# Patient Record
Sex: Male | Born: 1985 | Race: White | Hispanic: No | Marital: Single | Smoking: Never smoker
Health system: Southern US, Community
[De-identification: ages and names within clinical notes are randomized; demographics above are authoritative.]

## PROBLEM LIST (undated history)

## (undated) DIAGNOSIS — G43909 Migraine, unspecified, not intractable, without status migrainosus: Secondary | ICD-10-CM

## (undated) HISTORY — PX: NECK SURGERY: SHX720

---

## 2009-12-31 ENCOUNTER — Encounter: Admission: RE | Admit: 2009-12-31 | Discharge: 2009-12-31 | Payer: Self-pay | Admitting: Orthopedic Surgery

## 2018-01-18 ENCOUNTER — Other Ambulatory Visit: Payer: Self-pay | Admitting: Otolaryngology

## 2018-01-18 DIAGNOSIS — R1310 Dysphagia, unspecified: Secondary | ICD-10-CM

## 2018-01-24 ENCOUNTER — Ambulatory Visit
Admission: RE | Admit: 2018-01-24 | Discharge: 2018-01-24 | Disposition: A | Discharge status: Home / Self Care | Payer: BLUE CROSS/BLUE SHIELD | Source: Ambulatory Visit | Attending: Otolaryngology | Admitting: Otolaryngology

## 2018-01-24 DIAGNOSIS — R1310 Dysphagia, unspecified: Secondary | ICD-10-CM | POA: Insufficient documentation

## 2018-08-08 HISTORY — PX: SHOULDER SURGERY: SHX246

## 2018-08-08 HISTORY — PX: HERNIA REPAIR: SHX51

## 2019-05-07 ENCOUNTER — Other Ambulatory Visit: Payer: Self-pay | Admitting: Orthopedic Surgery

## 2019-05-07 DIAGNOSIS — M25511 Pain in right shoulder: Secondary | ICD-10-CM

## 2019-05-21 ENCOUNTER — Ambulatory Visit
Admission: RE | Admit: 2019-05-21 | Discharge: 2019-05-21 | Disposition: A | Discharge status: Home / Self Care | Payer: BC Managed Care – PPO | Source: Ambulatory Visit | Attending: Orthopedic Surgery | Admitting: Orthopedic Surgery

## 2019-05-21 ENCOUNTER — Other Ambulatory Visit: Payer: Self-pay

## 2019-05-21 DIAGNOSIS — M25511 Pain in right shoulder: Secondary | ICD-10-CM

## 2019-05-21 MED ORDER — GADOBUTROL 1 MMOL/ML IV SOLN
1.0000 mL | Freq: Once | INTRAVENOUS | Status: AC | PRN
  Administered 2019-05-21: 0.05 mL

## 2019-05-21 MED ORDER — IOHEXOL 180 MG/ML  SOLN
20.0000 mL | Freq: Once | INTRAMUSCULAR | Status: AC | PRN
  Administered 2019-05-21: 11:00:00 5 mL via INTRA_ARTICULAR

## 2019-05-21 MED ORDER — LIDOCAINE HCL (PF) 1 % IJ SOLN
5.0000 mL | Freq: Once | INTRAMUSCULAR | Status: AC
  Administered 2019-05-21: 5 mL
  Filled 2019-05-21: qty 5

## 2019-05-21 MED ORDER — SODIUM CHLORIDE (PF) 0.9 % IJ SOLN
10.0000 mL | INTRAMUSCULAR | Status: DC | PRN
  Administered 2019-05-21: 20 mL

## 2019-06-11 ENCOUNTER — Emergency Department: Payer: BC Managed Care – PPO

## 2019-06-11 ENCOUNTER — Encounter: Payer: Self-pay | Admitting: Emergency Medicine

## 2019-06-11 ENCOUNTER — Emergency Department
Admission: EM | Admit: 2019-06-11 | Discharge: 2019-06-11 | Disposition: A | Discharge status: Home / Self Care | Payer: BC Managed Care – PPO | Attending: Emergency Medicine | Admitting: Emergency Medicine

## 2019-06-11 ENCOUNTER — Other Ambulatory Visit: Payer: Self-pay

## 2019-06-11 DIAGNOSIS — G43909 Migraine, unspecified, not intractable, without status migrainosus: Secondary | ICD-10-CM | POA: Insufficient documentation

## 2019-06-11 DIAGNOSIS — R519 Headache, unspecified: Secondary | ICD-10-CM | POA: Diagnosis present

## 2019-06-11 HISTORY — DX: Migraine, unspecified, not intractable, without status migrainosus: G43.909

## 2019-06-11 LAB — COMPREHENSIVE METABOLIC PANEL
ALT: 29 U/L (ref 0–44)
AST: 21 U/L (ref 15–41)
Albumin: 4.3 g/dL (ref 3.5–5.0)
Alkaline Phosphatase: 42 U/L (ref 38–126)
Anion gap: 11 (ref 5–15)
BUN: 19 mg/dL (ref 6–20)
CO2: 25 mmol/L (ref 22–32)
Calcium: 9.5 mg/dL (ref 8.9–10.3)
Chloride: 102 mmol/L (ref 98–111)
Creatinine, Ser: 1.25 mg/dL — ABNORMAL HIGH (ref 0.61–1.24)
GFR calc Af Amer: >60 mL/min (ref >60)
GFR calc non Af Amer: >60 mL/min (ref >60)
Glucose, Bld: 107 mg/dL — ABNORMAL HIGH (ref 70–99)
Potassium: 4.2 mmol/L (ref 3.5–5.1)
Sodium: 138 mmol/L (ref 135–145)
Total Bilirubin: 0.8 mg/dL (ref 0.3–1.2)
Total Protein: 7.1 g/dL (ref 6.5–8.1)

## 2019-06-11 LAB — CBC WITH DIFFERENTIAL/PLATELET
Abs Immature Granulocytes: 0.05 10*3/uL (ref 0.00–0.07)
Basophils Absolute: 0 10*3/uL (ref 0.0–0.1)
Basophils Relative: 0 %
Eosinophils Absolute: 0 10*3/uL (ref 0.0–0.5)
Eosinophils Relative: 0 %
HCT: 42.5 % (ref 39.0–52.0)
Hemoglobin: 14.9 g/dL (ref 13.0–17.0)
Immature Granulocytes: 0 %
Lymphocytes Relative: 8 %
Lymphs Abs: 0.9 10*3/uL (ref 0.7–4.0)
MCH: 30.8 pg (ref 26.0–34.0)
MCHC: 35.1 g/dL (ref 30.0–36.0)
MCV: 87.8 fL (ref 80.0–100.0)
Monocytes Absolute: 0.7 10*3/uL (ref 0.1–1.0)
Monocytes Relative: 6 %
Neutro Abs: 9.7 10*3/uL — ABNORMAL HIGH (ref 1.7–7.7)
Neutrophils Relative %: 86 %
Platelets: 221 10*3/uL (ref 150–400)
RBC: 4.84 MIL/uL (ref 4.22–5.81)
RDW: 11.9 % (ref 11.5–15.5)
WBC: 11.4 10*3/uL — ABNORMAL HIGH (ref 4.0–10.5)
nRBC: 0 % (ref 0.0–0.2)

## 2019-06-11 MED ORDER — PROCHLORPERAZINE EDISYLATE 10 MG/2ML IJ SOLN
10.0000 mg | Freq: Once | INTRAMUSCULAR | Status: AC
  Administered 2019-06-11: 10 mg via INTRAVENOUS
  Filled 2019-06-11: qty 2

## 2019-06-11 MED ORDER — SODIUM CHLORIDE 0.9 % IV BOLUS
1000.0000 mL | Freq: Once | INTRAVENOUS | Status: AC
  Administered 2019-06-11: 16:00:00 1000 mL via INTRAVENOUS

## 2019-06-11 MED ORDER — PROCHLORPERAZINE MALEATE 10 MG PO TABS: 10.0000 mg | ORAL_TABLET | Freq: Three times a day (TID) | ORAL | 0 refills | Status: DC | PRN

## 2019-06-11 NOTE — ED Triage Notes (Signed)
Pt in via [facility] from work, complaints of migraine x one day w/ associated dizziness and photophobia.  A/Ox4, NAD noted at this time.

## 2019-06-11 NOTE — ED Provider Notes (Signed)
[facility] Emergency Department Provider Note   ____________________________________________   I have reviewed the triage vital signs and the nursing notes.   HISTORY  Chief Complaint Migraine   History limited by: Not Limited   HPI Kyle Bernard is a 33 y.o. male who presents to the emergency department today with concerns for headache, dizziness and nausea. The patient is feeling fine this morning.  Started around 9:00 however he started developing his symptoms.  He does have a history of migraine headaches however has not had the dizziness with it. The dizziness is worse with movement. States it is severe. Was driving when it got worse and caused him to pull off the road and call 911. The patient was recently treated for RMSF. States he also recently had right shoulder surgery.   Records reviewed. Per medical record review patient has a history of migraines.  Past Medical History:  Diagnosis Date  . Migraines     There are no active problems to display for this patient.   Past Surgical History:  Procedure Laterality Date  . SHOULDER SURGERY Right 2020    Prior to Admission medications   Not on File    Allergies Morphine and related  No family history on file.  Social History Social History   Tobacco Use  . Smoking status: Never Smoker  . Smokeless tobacco: Never Used  Substance Use Topics  . Alcohol use: Never    Frequency: Never  . Drug use: Never    Review of Systems Constitutional: No fever/chills Eyes: No visual changes. ENT: No sore throat. Cardiovascular: Denies chest pain. Respiratory: Denies shortness of breath. Gastrointestinal: No abdominal pain. Positive for nausea.  Genitourinary: Negative for dysuria. Musculoskeletal: Negative for back pain. Skin: Negative for rash. Neurological: Positive for headache and dizziness.  ____________________________________________   PHYSICAL EXAM:  VITAL SIGNS: ED Triage  Vitals  Enc Vitals Group     BP 06/11/19 1419 136/85     Pulse Rate 06/11/19 1419 68     Resp 06/11/19 1419 16     Temp 06/11/19 1419 98 F (36.7 C)     Temp Source 06/11/19 1419 Oral     SpO2 06/11/19 1419 99 %     Weight 06/11/19 1423 175 lb (79.4 kg)     Height 06/11/19 1423 6\' 1"  (1.854 m)     Head Circumference --      Peak Flow --      Pain Score 06/11/19 1423 8   Constitutional: Alert and oriented.  Eyes: Conjunctivae are normal.  ENT      Head: Normocephalic and atraumatic.      Nose: No congestion/rhinnorhea.      Mouth/Throat: Mucous membranes are moist.      Neck: No stridor. Hematological/Lymphatic/Immunilogical: No cervical lymphadenopathy. Cardiovascular: Normal rate, regular rhythm.  No murmurs, rubs, or gallops.  Respiratory: Normal respiratory effort without tachypnea nor retractions. Breath sounds are clear and equal bilaterally. No wheezes/rales/rhonchi. Gastrointestinal: Soft and non tender. No rebound. No guarding.  Genitourinary: Deferred Musculoskeletal: Normal range of motion in all extremities. No lower extremity edema. Neurologic:  Normal speech and language. No gross focal neurologic deficits are appreciated.  Skin:  Skin is warm, dry and intact. No rash noted. Psychiatric: Mood and affect are normal. Speech and behavior are normal. Patient exhibits appropriate insight and judgment.  ____________________________________________    LABS (pertinent positives/negatives)  CBC wbc 11.4, hgb 14.9, plt 221 CMP wnl except glu 107, cr 1.25  ____________________________________________  EKG  None  ____________________________________________    RADIOLOGY  CT head No acute or concerning findings  ____________________________________________   PROCEDURES  Procedures  ____________________________________________   INITIAL IMPRESSION / ASSESSMENT AND PLAN / ED COURSE  Pertinent labs & imaging results that were available during my care  of the patient were reviewed by me and considered in my medical decision making (see chart for details).   Patient presented to the emergency department today with concerns for headache, dizziness and some nausea.  Patient does have a history of migraines but denies similar symptoms in the past.  On exam patient without any focal weakness.  Blood work was checked without any concerning anemia or electrolyte abnormalities.  Patient's creatinine was minimally elevated and I did discuss this with the patient.  Head CT was obtained which not show any cranial mass or bleed.  Patient did feel significant improvement after IV fluids and Compazine.  Will give patient prescription for Compazine as well as neurology follow-up information. ____________________________________________   FINAL CLINICAL IMPRESSION(S) / ED DIAGNOSES  Final diagnoses:  Migraine without status migrainosus, not intractable, unspecified migraine type     Note: This dictation was prepared with Dragon dictation. Any transcriptional errors that result from this process are unintentional     Nance Pear, MD 06/11/19 [phone]

## 2019-06-11 NOTE — ED Notes (Signed)
PT ambulatory with steady gait with no distress or dizziness.

## 2019-06-11 NOTE — ED Triage Notes (Signed)
Pt comes into the ED via EMS from side of road when he attempted to leave work after having a migraine and became dizzy and hyperventilated . Pt is a/ox4 on arrival. #18g in [place], no meds given PTA. 136/88, 80HR, [id number]

## 2019-06-11 NOTE — Discharge Instructions (Signed)
Please seek medical attention for any high fevers, chest pain, shortness of breath, change in behavior, persistent vomiting, bloody stool or any other new or concerning symptoms.  

## 2020-04-26 IMAGING — CT CT HEAD W/O CM
3 series · 15 of 45 positions shown, 18 images · non-contrast
Comparison: None.

CLINICAL DATA: Headache with dizziness and photophobia.

EXAM:
CT HEAD WITHOUT CONTRAST
TECHNIQUE: Contiguous axial images were obtained from the base of the skull
through the vertex without intravenous contrast.

[Series 2: head wo · axial · 0.47mm/px · z∈[-107,+8]mm · 9 of 28 slices shown, 12 images]
[im 3/28  brain]
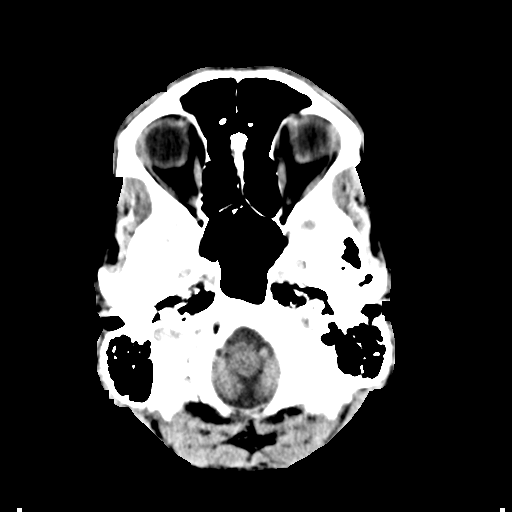
[im 3/28  bone]
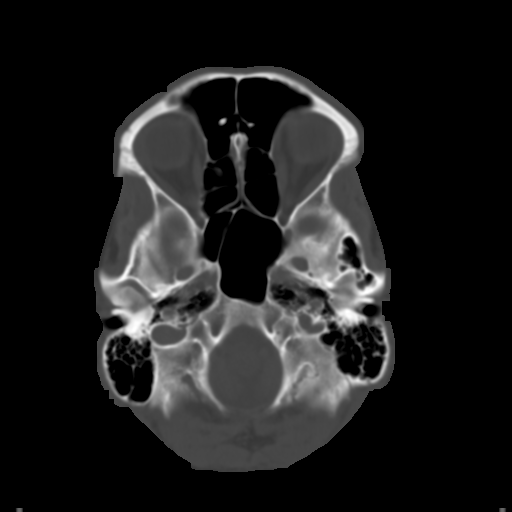
[im 6/28  brain]
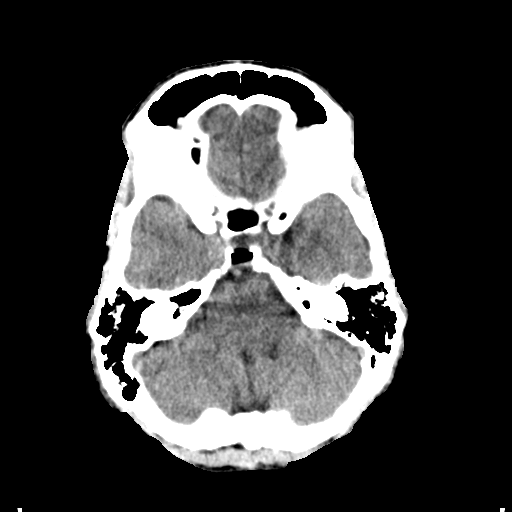
[im 9/28  brain]
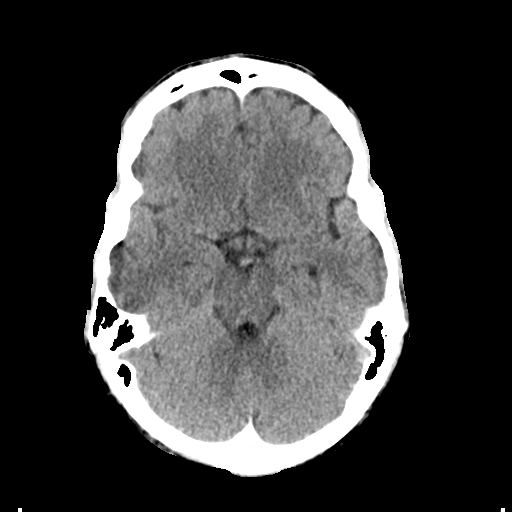
[im 12/28  brain]
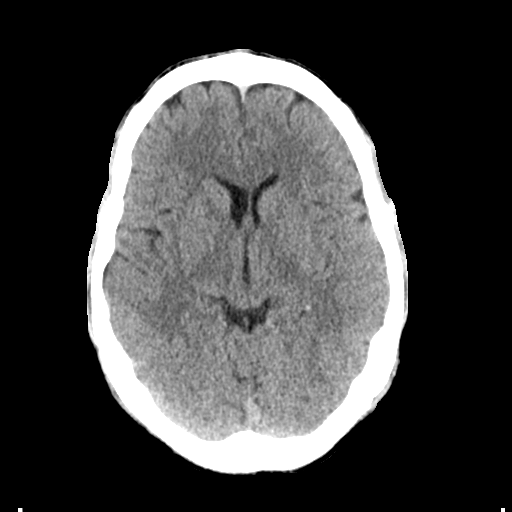
[im 15/28  brain]
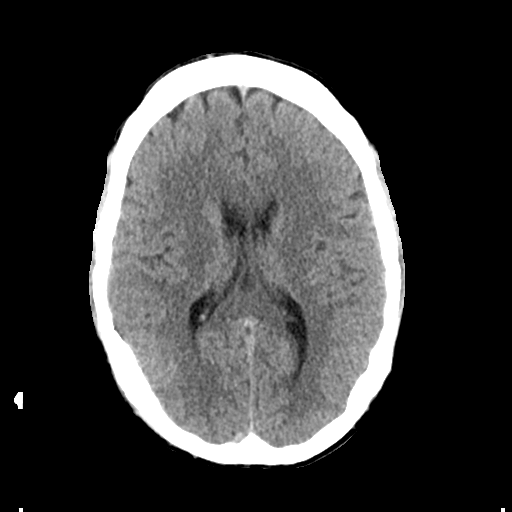
[im 15/28  bone]
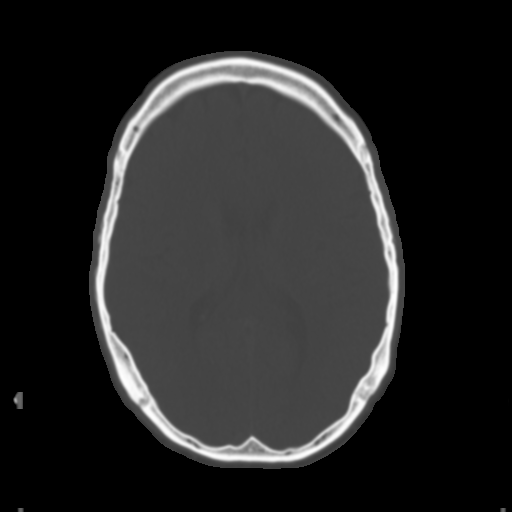
[im 17/28  brain]
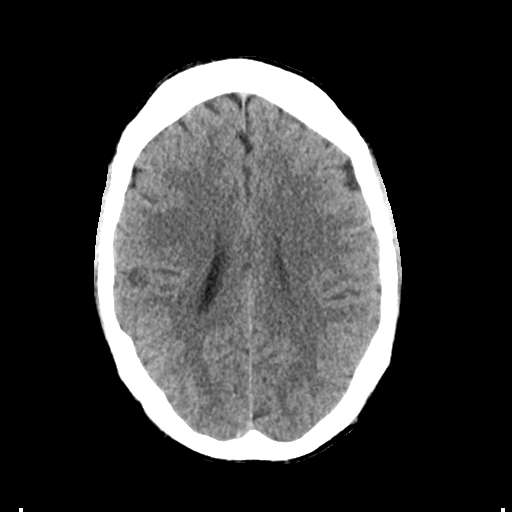
[im 20/28  brain]
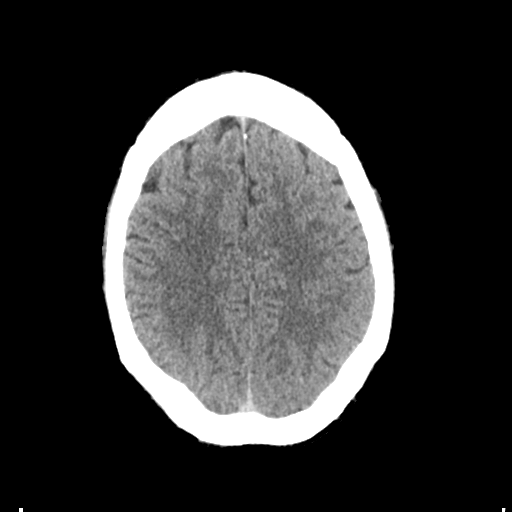
[im 23/28  brain]
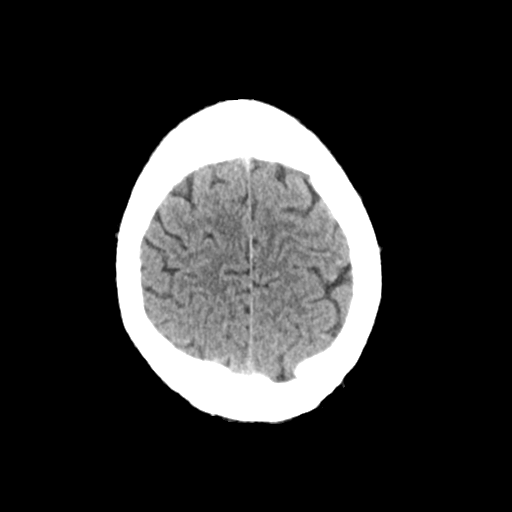
[im 26/28  brain]
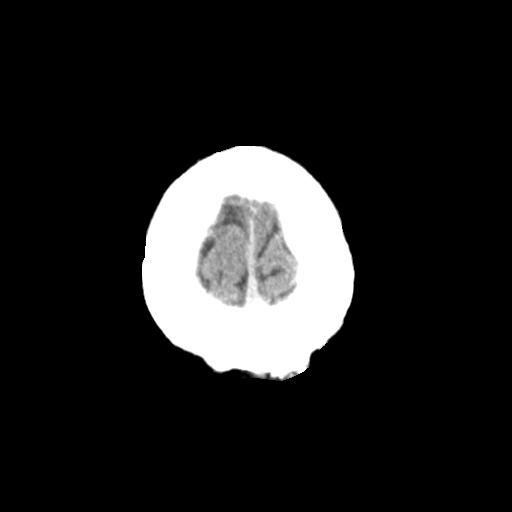
[im 26/28  bone]
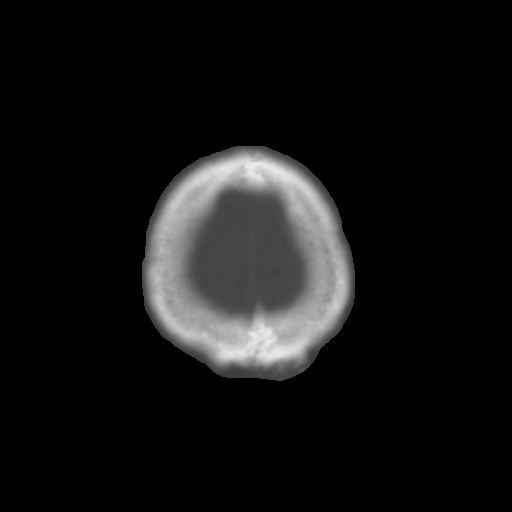

[Series 4: coronal soft tissue · coronal · 0.29mm/px · 3 of 67 slices shown]
[im 23/67  brain]
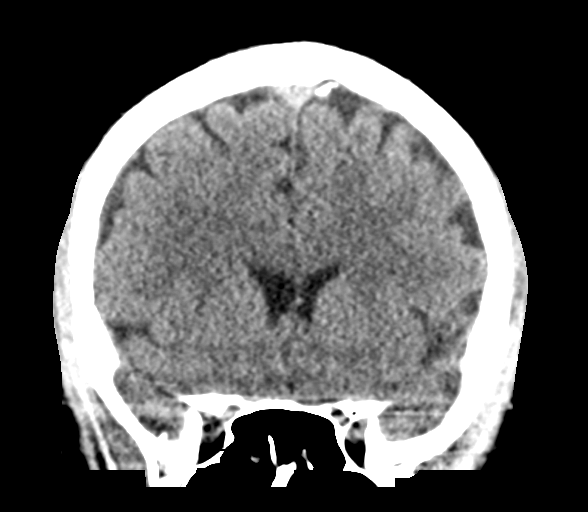
[im 30/67  brain]
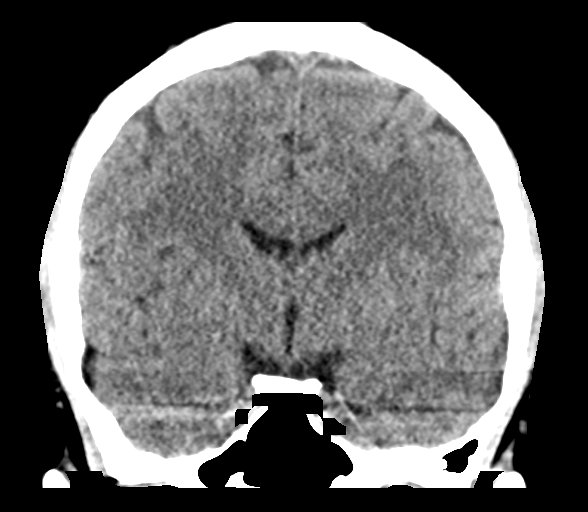
[im 37/67  brain]
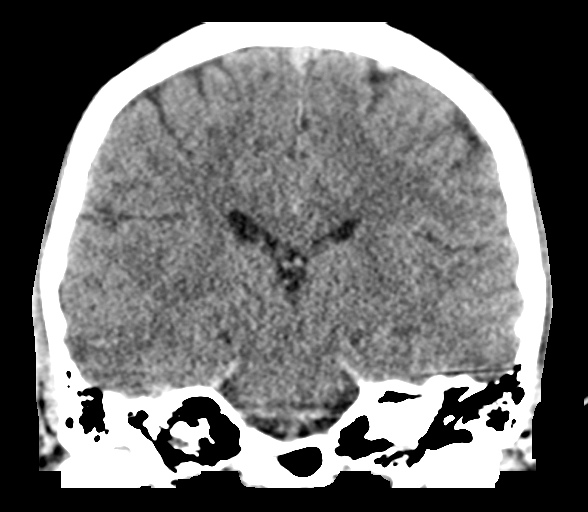

[Series 5: sagittal soft tissue · sagittal · 0.29mm/px · 3 of 58 slices shown]
[im 20/58  brain]
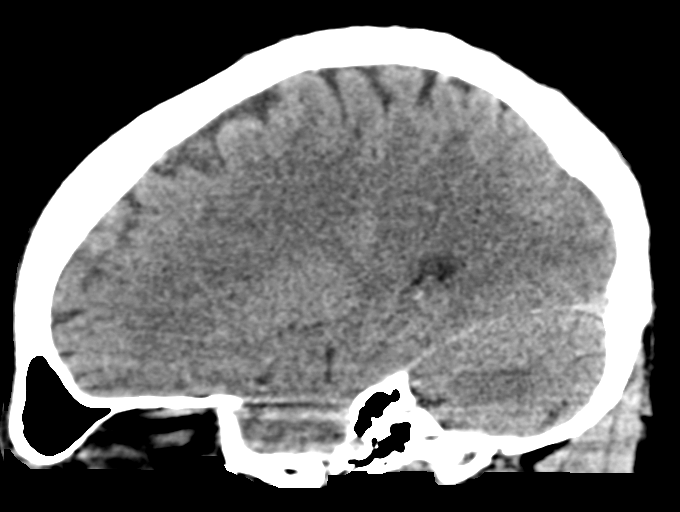
[im 29/58  brain]
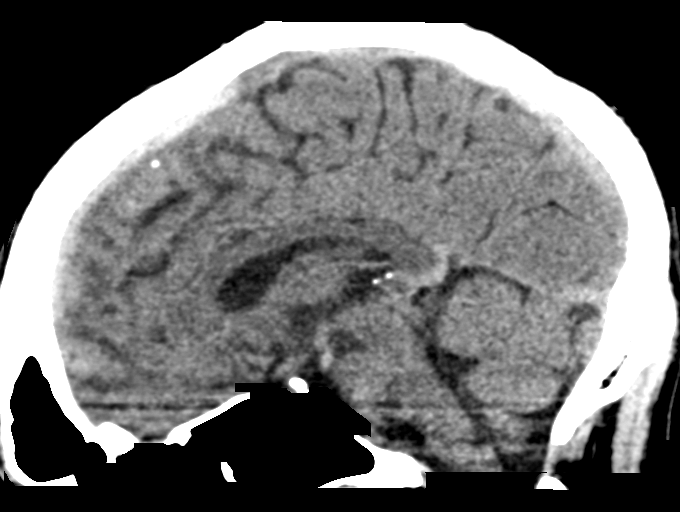
[im 39/58  brain]
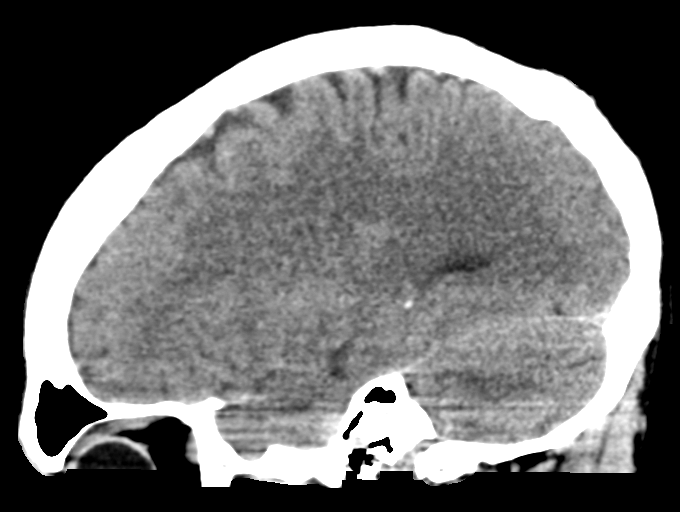

[15 of 45 positions shown; findings below may reference images not displayed]

FINDINGS: Brain: Ventricles are normal in size and configuration. All areas of
the brain demonstrate appropriate gray-white matter attenuation.
There is no mass, hemorrhage, edema or other evidence of acute
parenchymal abnormality. No extra-axial hemorrhage.

Vascular: No hyperdense vessel or unexpected calcification.

Skull: Normal. Negative for fracture or focal lesion.

Sinuses/Orbits: No acute finding.

Other: None.
IMPRESSION: Negative head CT. No intracranial mass, hemorrhage or edema.

## 2021-10-17 NOTE — Progress Notes (Incomplete)
10/19/2021 5:26 PM   Kyle Bernard August 03, 1986 [id number]  Referring provider: No referring provider defined for this encounter.  No chief complaint on file.    HPI: Kyle Bernard is a 36 y.o. male who presents today for further evaluation of vasectomy.    He denies a history of testicular trauma or pain.  No urinary issues.  No previous scrotal surgeries.   PMH: Past Medical History:  Diagnosis Date   Migraines     Surgical History: Past Surgical History:  Procedure Laterality Date   SHOULDER SURGERY Right 2020    Home Medications:  Allergies as of 10/19/2021       Reactions   Morphine And Related Other (See Comments)   Migraine        Medication List        Accurate as of October 17, 2021  5:26 PM. If you have any questions, ask your nurse or doctor.          prochlorperazine 10 MG tablet Commonly known as: COMPAZINE Take 1 tablet (10 mg total) by mouth every 8 (eight) hours as needed (headache).        Allergies:  Allergies  Allergen Reactions   Morphine And Related Other (See Comments)    Migraine     Family History: No family history on file.  Social History:  reports that he has never smoked. He has never used smokeless tobacco. He reports that he does not drink alcohol and does not use drugs.   Physical Exam: There were no vitals taken for this visit.  Constitutional:  Alert and oriented, No acute distress. HEENT: [place] AT, moist mucus membranes.  Trachea midline, no masses. Cardiovascular: No clubbing, cyanosis, or edema. Respiratory: Normal respiratory effort, no increased work of breathing. GI: Abdomen is soft, nontender, nondistended, no abdominal masses GU: Normal phallus.  Bilateral descended testicles without masses.  Vasa easily palpable bilaterally. Skin: No rashes, bruises or suspicious lesions. Neurologic: Grossly intact, no focal deficits, moving all 4 extremities. Psychiatric: Normal mood and affect.   Assessment &  Plan:    1. Vasectomy evaluation Today, we discussed what the vas deferens is, where it is located, and its function. We reviewed the procedure for vasectomy, it's risks, benefits, alternatives, and likelihood of achieving his goals. We discussed in detail the procedure, complications, and recovery as well as the need for clearance prior to unprotected intercourse. We discussed that vasectomy does not protect against sexually transmitted diseases. We discussed that this procedure does not result in immediate sterility and that they would need to use other forms of birth control until he has been cleared with negative postvasectomy semen analyses. I explained that the procedure is considered to be permanent and that attempts at reversal have varying degrees of success. These options include vasectomy reversal, sperm retrieval, and in vitro fertilization; these can be very expensive. We discussed the chance of postvasectomy pain syndrome which occurs in less than 5% of patients. I explained to the patient that there is no treatment to resolve this chronic pain, and that if it developed I would not be able to help resolve the issue, but that surgery is generally not needed for correction. I explained there have even been reports of systemic like illness associated with this chronic pain, and that there was no good cure. I explained that vasectomy it is not a 100% reliable form of birth control, and the risk of pregnancy after vasectomy is approximately 1 in 2000 men who had a  negative postvasectomy semen analysis or rare non-motile sperm. I explained that repeat vasectomy was necessary in less than 1% of vasectomy procedures when employing the type of technique that I use. I explained that he should refrain from ejaculation for approximately one week following vasectomy. I explained that there are other options for birth control which are permanent and non-permanent; we discussed these. I explained the rates of  surgical complications, such as symptomatic hematoma or infection, are low (1-2%) and vary with the surgeon's experience and criteria used to diagnose the complication.   The patient had the opportunity to ask questions to his stated satisfaction. He voiced understanding of the above factors and stated that he has read all the information provided to him and the packets and informed consent.  He is interested in receiving of Valium 10 mg prior to the procedure for the purpose of anxiolysis.  A prescription was given today.  He will have a driver on the day of the procedure.   No follow-ups on file.  I,Kailey Littlejohn,acting as a Neurosurgeon for Vanna Scotland, MD.,have documented all relevant documentation on the behalf of Vanna Scotland, MD,as directed by  Vanna Scotland, MD while in the presence of Vanna Scotland, MD.   [facility] [address] [place], [place] [address] [phone]

## 2021-10-19 ENCOUNTER — Ambulatory Visit (INDEPENDENT_AMBULATORY_CARE_PROVIDER_SITE_OTHER): Payer: Managed Care, Other (non HMO) | Admitting: Urology

## 2021-10-19 ENCOUNTER — Other Ambulatory Visit: Payer: Self-pay

## 2021-10-19 ENCOUNTER — Encounter: Payer: Self-pay | Admitting: Urology

## 2021-10-19 VITALS — BP 144/82 | HR 83 | Ht 73.0 in | Wt 175.0 lb

## 2021-10-19 DIAGNOSIS — Z87442 Personal history of urinary calculi: Secondary | ICD-10-CM

## 2021-10-19 DIAGNOSIS — Z8719 Personal history of other diseases of the digestive system: Secondary | ICD-10-CM | POA: Diagnosis not present

## 2021-10-19 NOTE — Patient Instructions (Signed)

## 2021-10-20 MED ORDER — DIAZEPAM 10 MG PO TABS: ORAL_TABLET | ORAL | 0 refills | Status: DC

## 2021-10-21 ENCOUNTER — Telehealth: Payer: Self-pay | Admitting: Urology

## 2021-10-21 ENCOUNTER — Telehealth: Payer: Self-pay

## 2021-10-21 LAB — MICROSCOPIC EXAMINATION
Epithelial Cells (non renal): NONE SEEN /hpf (ref 0–10)
WBC, UA: NONE SEEN /hpf (ref 0–5)

## 2021-10-21 LAB — URINALYSIS, COMPLETE
Bilirubin, UA: NEGATIVE
Glucose, UA: NEGATIVE
Ketones, UA: NEGATIVE
Leukocytes,UA: NEGATIVE
Nitrite, UA: NEGATIVE
Protein,UA: NEGATIVE
RBC, UA: NEGATIVE
Specific Gravity, UA: 1.02 (ref 1.005–1.030)
Urobilinogen, Ur: 0.2 mg/dL (ref 0.2–1.0)
pH, UA: 7 (ref 5.0–7.5)

## 2021-10-21 NOTE — Telephone Encounter (Signed)
I recall that it was normal but has not posted to Epic yet.  It will be available by mychart when it does. ? ?Vanna Scotland, MD ? ?

## 2021-10-21 NOTE — Telephone Encounter (Signed)
Pt called triage line requesting results of urinalysis. Please advise.  ?

## 2021-10-22 ENCOUNTER — Encounter: Payer: Self-pay | Admitting: Urology

## 2021-10-22 NOTE — Telephone Encounter (Signed)
Reviewed UA in detail, informed per Dr. Apolinar Junes UA normal. Advised to use warm compresses and take OTC pain medications per Dr. Delana Meyer recommendations at visit 10/19/21. Denies burning or unable to void, takes a minute to urinate, having urinary hesitancy. Advised to seek medical attention if symptoms worsen. Voiced understanding.  ?

## 2021-10-22 NOTE — Telephone Encounter (Signed)
Left patient a VM with details, asked to return call with any questions.  

## 2021-10-22 NOTE — Telephone Encounter (Signed)
Pt calling again asking about UA results, I advised it looked normal, pt is asking about the " crystals" that are " present" ?

## 2021-10-27 ENCOUNTER — Ambulatory Visit: Payer: Managed Care, Other (non HMO)

## 2021-11-02 ENCOUNTER — Telehealth: Payer: Self-pay | Admitting: Urology

## 2021-11-02 NOTE — Telephone Encounter (Signed)
Pt dropped off CD for Dr Apolinar Junes, I put it on her desk. ?

## 2021-11-09 ENCOUNTER — Telehealth: Payer: Self-pay | Admitting: Urology

## 2021-11-09 NOTE — Telephone Encounter (Signed)
Patient informed, voiced understanding.  °

## 2021-11-09 NOTE — Progress Notes (Signed)
11/10/2021 ? ?CC: No chief complaint on file. ? ? ?HPI: ?Kyle Bernard is a 36 y.o. male with a personal history of flank pain who presents today for a vasectomy.  ? ?He had a robotic right inguinal hernia repair surgery in 2020 with Dr Alvino Chapel.   ? ?He is scheduled to undergo a RUS to further evaluate flank pain which was negative.  Scrotal ultrasound also unremarkable.   ? ?He has 4 children.  He and his wife agree that they do not desire any biological pregnancies.  ? ?Vitals:  ? 11/10/21 1531  ?BP: (!) 169/91  ?Pulse: 80  ? ?NED. A&Ox3.   ?No respiratory distress   ?Abd soft, NT, ND ?Normal external genitalia with patent urethral meatus ? ?Time out performed and consent confirmed. ? ?Bilateral Vasectomy Procedure ? ?Pre-Procedure: ?- Patient's scrotum was prepped and draped for vasectomy. ?- The vas was palpated through the scrotal skin on the left. ?- 1% Xylocaine was injected into the skin and surrounding tissue for placement  ?- In a similar manner, the vas on the right was identified, anesthetized, and stabilized. ? ?Procedure: ?- A #11 blade was used to make a small stab incision in the skin overlying the vas ?- The left vas was isolated and brought up through the incision exposing that structure. ?- Bleeding points were cauterized as they occurred. ?- The vas was free from the surrounding structures and brought to the view. ?- A segment was positioned for placement with a hemostat. ?- A second hemostat was placed and a small segment between the two hemostats and was removed for inspection. ?- Each end of the transected vas lumen was fulgurated/ obliterated using needlepoint electrocautery ?-A fascial interposition was performed on testicular end of the vas using #3-0 chromic suture ?-The same procedure was performed on the right. ?- A single suture of #3-0 chromic catgut was used to close each lateral scrotal skin incision ?- A dressing was applied. ? ?Post-Procedure: ?- Patient was instructed in  care of the operative area ?- A specimen is to be delivered in 12 weeks  ? -Another form of contraception is to be used until post vasectomy semen analysis ? ?Conley Rolls as a scribe for Hollice Espy, MD.,have documented all relevant documentation on the behalf of Hollice Espy, MD,as directed by  Hollice Espy, MD while in the presence of Hollice Espy, MD. ? ?I have reviewed the above documentation for accuracy and completeness, and I agree with the above.  ? ?Hollice Espy, MD ? ?

## 2021-11-09 NOTE — Telephone Encounter (Signed)
Pt called to make sure U/S of kidneys was normal prior to vasectomy tomorrow. ?

## 2021-11-09 NOTE — Telephone Encounter (Signed)
Yes everything looks fine, good to go. ? ?Kyle Scotland, MD ? ?

## 2021-11-10 ENCOUNTER — Ambulatory Visit (INDEPENDENT_AMBULATORY_CARE_PROVIDER_SITE_OTHER): Payer: Managed Care, Other (non HMO) | Admitting: Urology

## 2021-11-10 VITALS — BP 169/91 | HR 80

## 2021-11-10 DIAGNOSIS — Z302 Encounter for sterilization: Secondary | ICD-10-CM | POA: Diagnosis not present

## 2021-11-10 NOTE — Patient Instructions (Signed)

## 2021-11-16 ENCOUNTER — Encounter: Payer: 59 | Admitting: Urology

## 2022-02-10 ENCOUNTER — Other Ambulatory Visit: Payer: Managed Care, Other (non HMO)

## 2022-02-10 DIAGNOSIS — Z9852 Vasectomy status: Secondary | ICD-10-CM

## 2022-02-11 LAB — POST-VAS SPERM EVALUATION,QUAL: Volume: 3.7 mL

## 2022-02-21 ENCOUNTER — Encounter: Payer: Self-pay | Admitting: Physician Assistant

## 2022-02-21 ENCOUNTER — Ambulatory Visit (INDEPENDENT_AMBULATORY_CARE_PROVIDER_SITE_OTHER): Payer: Managed Care, Other (non HMO) | Admitting: Physician Assistant

## 2022-02-21 VITALS — BP 117/73 | HR 59 | Ht 73.0 in | Wt 180.0 lb

## 2022-02-21 DIAGNOSIS — N50811 Right testicular pain: Secondary | ICD-10-CM | POA: Diagnosis not present

## 2022-02-21 MED ORDER — CELECOXIB 100 MG PO CAPS: 100.0000 mg | ORAL_CAPSULE | Freq: Two times a day (BID) | ORAL | 0 refills | Status: AC

## 2022-02-21 NOTE — Progress Notes (Signed)
02/21/2022 5:24 PM   Kyle Bernard February 23, 1986 [id number]  CC: Chief Complaint  Patient presents with   Other   HPI: Kyle Bernard is a 36 y.o. male who underwent vasectomy with Dr. Apolinar Junes on 11/10/2021 who presents today for evaluation of postvasectomy testicular pain.   Right-sided pain following vasectomy which has been stable for the past 3 months.  He states the pain occurs with palpation over the right testicle.  He has not found a focal point that triggers the pain, but feels that manipulation of the testicle in general can trigger it.  He describes the pain as severe.  He has noticed that he feels possible scar tissue superior to the testicle.  He denies dysuria or fevers.  He is not concerned for STIs today and declines testing.  PMH: Past Medical History:  Diagnosis Date   Migraines     Surgical History: Past Surgical History:  Procedure Laterality Date   HERNIA REPAIR Bilateral 08/08/2018   NECK SURGERY     SHOULDER SURGERY Right 2020    Home Medications:  Allergies as of 02/21/2022       Reactions   Morphine And Related Other (See Comments)   Migraine        Medication List        Accurate as of February 21, 2022  5:24 PM. If you have any questions, ask your nurse or doctor.          STOP taking these medications    diazepam 10 MG tablet Commonly known as: Valium Stopped by: Carman Ching, PA-C       TAKE these medications    busPIRone 10 MG tablet Commonly known as: BUSPAR Take 10 mg by mouth 2 (two) times daily.   celecoxib 100 MG capsule Commonly known as: CeleBREX Take 1 capsule (100 mg total) by mouth 2 (two) times daily for 14 days. Started by: Carman Ching, PA-C   zolmitriptan 5 MG nasal solution Commonly known as: ZOMIG Place into the nose.        Allergies:  Allergies  Allergen Reactions   Morphine And Related Other (See Comments)    Migraine     Family History: History reviewed. No pertinent family  history.  Social History:   reports that he has never smoked. He has never used smokeless tobacco. He reports that he does not drink alcohol and does not use drugs.  Physical Exam: BP 117/73   Pulse (!) 59   Ht 6\' 1"  (1.854 m)   Wt 180 lb (81.6 kg)   BMI 23.75 kg/m   Constitutional:  Alert and oriented, no acute distress, nontoxic appearing HEENT: [place], AT Cardiovascular: No clubbing, cyanosis, or edema Respiratory: Normal respiratory effort, no increased work of breathing GU: Bilateral descended testicles.  Right epididymis is a bit prominent but not terribly edematous or tender to palpation.  There is a very small, firm nodule superior to the testicle at the anticipated site of vasectomy. Skin: No rashes, bruises or suspicious lesions Neurologic: Grossly intact, no focal deficits, moving all 4 extremities Psychiatric: Normal mood and affect  Assessment & Plan:   1. Right testicular pain Possible sperm granuloma superior to the right testicle, though we discussed this is not terribly likely given his vasectomy was 3 months ago.  Regardless, I think his pain is inflammatory in nature and we discussed a 2-week course of NSAIDs to reduce inflammation.  We will prescribe Celebrex today and have him follow-up in clinic with me if  his symptoms do not improve.  He is in agreement with this plan.  No indication for urgent imaging today given subacute presentation.  May consider scrotal ultrasound for persistent symptoms. - celecoxib (CELEBREX) 100 MG capsule; Take 1 capsule (100 mg total) by mouth 2 (two) times daily for 14 days.  Dispense: 28 capsule; Refill: 0  Return if symptoms worsen or fail to improve.  Carman Ching, PA-C  [facility] [address] [place], [place] [address] [phone]

## 2023-05-14 ENCOUNTER — Ambulatory Visit (INDEPENDENT_AMBULATORY_CARE_PROVIDER_SITE_OTHER): Payer: Managed Care, Other (non HMO)

## 2023-05-14 ENCOUNTER — Ambulatory Visit
Admission: RE | Admit: 2023-05-14 | Discharge: 2023-05-14 | Disposition: A | Discharge status: Home / Self Care | Payer: Managed Care, Other (non HMO) | Source: Ambulatory Visit | Attending: Family Medicine | Admitting: Family Medicine

## 2023-05-14 VITALS — BP 126/92 | HR 55 | Temp 98.4°F | Resp 15 | Ht 73.0 in | Wt 179.9 lb

## 2023-05-14 DIAGNOSIS — R001 Bradycardia, unspecified: Secondary | ICD-10-CM | POA: Diagnosis not present

## 2023-05-14 DIAGNOSIS — R059 Cough, unspecified: Secondary | ICD-10-CM | POA: Diagnosis not present

## 2023-05-14 DIAGNOSIS — J069 Acute upper respiratory infection, unspecified: Secondary | ICD-10-CM | POA: Diagnosis not present

## 2023-05-14 LAB — SARS CORONAVIRUS 2 BY RT PCR: SARS Coronavirus 2 by RT PCR: NEGATIVE

## 2023-05-14 LAB — GROUP A STREP BY PCR: Group A Strep by PCR: NOT DETECTED

## 2023-05-14 NOTE — ED Triage Notes (Signed)
Patient states that his son recently had strep throat.  Patient c/o cough, headache, and nasal congestion 2 days ago.  Patient unsure of fevers.

## 2023-05-14 NOTE — ED Provider Notes (Signed)
[facility]    CSN: [id number] Arrival date & time: 05/14/23  0857      History   Chief Complaint Chief Complaint  Patient presents with   Cough    Appointment   Headache    HPI Kyle Bernard is a 37 y.o. male.   HPI  History obtained from the patient. Kyle Bernard presents for cough, headache that started 2 days ago.  His wife and son have strep.  Endorses sneezing.  Has non-bloody sputum., [place] and want to be sure he is not sick.    He was sweating last night but is unsure if he has a fever.  No vomiting, diarrhea, rhinorrhea, nasal congestion.  Has been around 4 kids who have been sick.  Has been more tired than normal.  He reports he gets pneumonia several times a year after having [name].  No history of asthma. Denies smoking or vaping.         Past Medical History:  Diagnosis Date   Migraines     There are no problems to display for this patient.   Past Surgical History:  Procedure Laterality Date   HERNIA REPAIR Bilateral 08/08/2018   NECK SURGERY     SHOULDER SURGERY Right 2020       Home Medications    Prior to Admission medications   Medication Sig Start Date End Date Taking? Authorizing Provider  busPIRone (BUSPAR) 10 MG tablet Take 10 mg by mouth 2 (two) times daily. 10/21/21   [provider]  zolmitriptan (ZOMIG) 5 MG nasal solution Place into the nose. 04/10/19   [provider]    Family History History reviewed. No pertinent family history.  Social History Social History   Tobacco Use   Smoking status: Never   Smokeless tobacco: Never  Vaping Use   Vaping status: Never Used  Substance Use Topics   Alcohol use: Never   Drug use: Never     Allergies   Morphine and codeine   Review of Systems Review of Systems: negative unless otherwise stated in HPI.      Physical Exam Triage Vital Signs ED Triage Vitals  Encounter Vitals Group     BP       Systolic BP Percentile      Diastolic BP Percentile      Pulse      Resp      Temp      Temp src      SpO2      Weight      Height      Head Circumference      Peak Flow      Pain Score      Pain Loc      Pain Education      Exclude from Growth Chart    No data found.  Updated Vital Signs BP (!) 126/92 (BP Location: Left Arm)   Pulse (!) 55   Temp 98.4 F (36.9 C) (Oral)   Resp 15   Ht 6\' 1"  (1.854 m)   Wt 81.6 kg   SpO2 100%   BMI 23.73 kg/m   Visual Acuity Right Eye Distance:   Left Eye Distance:   Bilateral Distance:    Right Eye Near:   Left Eye Near:    Bilateral Near:     Physical Exam GEN:     alert, ill but non-toxic appearing male in no distress  HENT:  mucus membranes moist, no nasal discharge EYES:   pupils equal and reactive, no scleral injection or discharge NECK:  normal ROM, no meningismus   RESP:  no increased work of breathing, clear to auscultation bilaterally CVS:   regular rate and rhythm Skin:   warm and dry    UC Treatments / Results  Labs (all labs ordered are listed, but only abnormal results are displayed) Labs Reviewed  GROUP A STREP BY PCR  SARS CORONAVIRUS 2 BY RT PCR    EKG   Radiology DG Chest 2 View  Result Date: 05/14/2023 CLINICAL DATA:  37 year old male with history of cough. EXAM: CHEST - 2 VIEW COMPARISON:  No priors. FINDINGS: Lung volumes are normal. No consolidative airspace disease. No pleural effusions. No pneumothorax. No pulmonary nodule or mass noted. Pulmonary vasculature and the cardiomediastinal silhouette are within normal limits. Orthopedic fixation hardware in the lower cervical spine incidentally noted. IMPRESSION: 1.  No radiographic evidence of acute cardiopulmonary disease. Electronically Signed   By: Trudie Reed M.D.   On: 05/14/2023 10:05    Procedures Procedures (including critical care time)  Medications Ordered in UC Medications - No data to display  Initial Impression /  Assessment and Plan / UC Course  I have reviewed the triage vital signs and the nursing notes.  Pertinent labs & imaging results that were available during my care of the patient were reviewed by me and considered in my medical decision making (see chart for details).       Pt is a 37 y.o. male who presents for 2 days of respiratory symptoms with recent strep exposure. He has been exposed to 4 sick kids. Kyle Bernard is afebrile here. Satting well on room air. He is bradycardic. He reports he normally ranges to mid 60s-70s.  He doesn't recall being this low.  Obtain EKG.   Overall pt is ill but non-toxic appearing, well hydrated, without respiratory distress. Pulmonary exam is unremarkable.  COVID and strep PCR testing obtained and were negative.  Patient requesting a chest x-ray as he gets frequent pneumonia after having [name].  I do not know of an association with recurrent pneumonia and [name] but ordered chest x-ray.   On deeper chart review, pt had previous EKG on 12/29/15 with HR 54 as reports by the results though non-visible for review.  EKG here today showing sinus bradycardia, heart rate 55, no acute ST or T wave changes or ischemia.  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  Chest xray personally reviewed by me without focal pneumonia, pleural effusion, cardiomegaly or pneumothorax.   History consistent with viral respiratory illness. Discussed symptomatic treatment.  Explained lack of efficacy of antibiotics in viral disease.  Typical duration of symptoms discussed.   Return and ED precautions given and voiced understanding. Discussed MDM, treatment plan and plan for follow-up with patient who agrees with plan.     Final Clinical Impressions(s) / UC Diagnoses   Final diagnoses:  Viral URI with cough     Discharge Instructions      Your strep and COVID test were negative.  Your chest x-ray did not show evidence of  pneumonia, fluid in your lungs or bronchitis.  I suspect your symptoms are viral in nature and will resolve over the next 1 to 2 weeks.   You can take Tylenol and/or Ibuprofen as needed for fever reduction and pain relief.    For  cough: honey 1/2 to 1 teaspoon (you can dilute the honey in water or another fluid).  You can also use guaifenesin and dextromethorphan for cough. You can use a humidifier for chest congestion and cough.  If you don't have a humidifier, you can sit in the bathroom with the hot shower running.      For sore throat: try warm salt water gargles, Mucinex sore throat cough drops or cepacol lozenges, throat spray, warm tea or water with lemon/honey, popsicles or ice, or OTC cold relief medicine for throat discomfort. You can also purchase chloraseptic spray at the pharmacy or dollar store.   For congestion: take a daily anti-histamine like Zyrtec, Claritin, and a oral decongestant, such as pseudoephedrine.  You can also use Flonase 1-2 sprays in each nostril daily. Afrin is also a good option, if you do not have high blood pressure.    It is important to stay hydrated: drink plenty of fluids (water, gatorade/powerade/pedialyte, juices, or teas) to keep your throat moisturized and help further relieve irritation/discomfort.    Return or go to the Emergency Department if symptoms worsen or do not improve in the next few days      ED Prescriptions   None    PDMP not reviewed this encounter.   Katha Cabal, DO 05/14/23 1011

## 2023-05-14 NOTE — Discharge Instructions (Addendum)
Your strep and COVID test were negative.  Your chest x-ray did not show evidence of pneumonia, fluid in your lungs or bronchitis.  I suspect your symptoms are viral in nature and will resolve over the next 1 to 2 weeks.   You can take Tylenol and/or Ibuprofen as needed for fever reduction and pain relief.    For cough: honey 1/2 to 1 teaspoon (you can dilute the honey in water or another fluid).  You can also use guaifenesin and dextromethorphan for cough. You can use a humidifier for chest congestion and cough.  If you don't have a humidifier, you can sit in the bathroom with the hot shower running.      For sore throat: try warm salt water gargles, Mucinex sore throat cough drops or cepacol lozenges, throat spray, warm tea or water with lemon/honey, popsicles or ice, or OTC cold relief medicine for throat discomfort. You can also purchase chloraseptic spray at the pharmacy or dollar store.   For congestion: take a daily anti-histamine like Zyrtec, Claritin, and a oral decongestant, such as pseudoephedrine.  You can also use Flonase 1-2 sprays in each nostril daily. Afrin is also a good option, if you do not have high blood pressure.    It is important to stay hydrated: drink plenty of fluids (water, gatorade/powerade/pedialyte, juices, or teas) to keep your throat moisturized and help further relieve irritation/discomfort.    Return or go to the Emergency Department if symptoms worsen or do not improve in the next few days

## 2023-08-03 ENCOUNTER — Other Ambulatory Visit: Payer: Self-pay

## 2023-08-03 ENCOUNTER — Emergency Department
Admission: EM | Admit: 2023-08-03 | Discharge: 2023-08-03 | Disposition: A | Discharge status: Home / Self Care | Payer: Managed Care, Other (non HMO) | Attending: Emergency Medicine | Admitting: Emergency Medicine

## 2023-08-03 ENCOUNTER — Emergency Department: Payer: Managed Care, Other (non HMO)

## 2023-08-03 DIAGNOSIS — R079 Chest pain, unspecified: Secondary | ICD-10-CM | POA: Diagnosis present

## 2023-08-03 DIAGNOSIS — R0789 Other chest pain: Secondary | ICD-10-CM | POA: Diagnosis not present

## 2023-08-03 LAB — TROPONIN I (HIGH SENSITIVITY)
Troponin I (High Sensitivity): 3 ng/L (ref <18)
Troponin I (High Sensitivity): 4 ng/L (ref <18)

## 2023-08-03 LAB — CBC
HCT: 42 % (ref 39.0–52.0)
Hemoglobin: 14.6 g/dL (ref 13.0–17.0)
MCH: 31.4 pg (ref 26.0–34.0)
MCHC: 34.8 g/dL (ref 30.0–36.0)
MCV: 90.3 fL (ref 80.0–100.0)
Platelets: 215 10*3/uL (ref 150–400)
RBC: 4.65 MIL/uL (ref 4.22–5.81)
RDW: 12 % (ref 11.5–15.5)
WBC: 4.2 10*3/uL (ref 4.0–10.5)
nRBC: 0 % (ref 0.0–0.2)

## 2023-08-03 LAB — BASIC METABOLIC PANEL
Anion gap: 7 (ref 5–15)
BUN: 16 mg/dL (ref 6–20)
CO2: 24 mmol/L (ref 22–32)
Calcium: 8.7 mg/dL — ABNORMAL LOW (ref 8.9–10.3)
Chloride: 104 mmol/L (ref 98–111)
Creatinine, Ser: 1.06 mg/dL (ref 0.61–1.24)
GFR, Estimated: >60 mL/min (ref >60)
Glucose, Bld: 81 mg/dL (ref 70–99)
Potassium: 3.9 mmol/L (ref 3.5–5.1)
Sodium: 135 mmol/L (ref 135–145)

## 2023-08-03 NOTE — Discharge Instructions (Signed)
Your blood work, EKG, and chest x-ray are normal.  Please follow-up with your outpatient provider.  Please return for any new, worsening, or change in symptoms or other concerns.  It was a pleasure caring for you today.

## 2023-08-03 NOTE — ED Triage Notes (Signed)
Pt to ED for chest pain started last night. Reports took xanax last night and went to sleep but woke up and did not feel better. +shob.  NAD noted +h/a

## 2023-08-03 NOTE — ED Provider Notes (Signed)
[facility] Provider Note    Event Date/Time   First MD Initiated Contact with Patient 08/03/23 1256     (approximate)   History   Chest Pain   HPI  Kyle Bernard is a 37 y.o. male with a past medical history of anxiety who presents today for evaluation of chest pain that began last night.  Patient reports that he felt sharp chest pain last night and he took Xanax with improvement of his symptoms.  He reports that he continues to feel chest tightness this morning and mild shortness of breath, prompting him to come in for evaluation.  He denies any personal or family history of PE or DVT.  He has not had any hemoptysis or cough.  He denies calf pain or leg swelling.  He denies any radiation of pain to his back.  He currently does not have any pain.  He reports that he does not smoke or use any substances.  There are no active problems to display for this patient.         Physical Exam   Triage Vital Signs: ED Triage Vitals  Encounter Vitals Group     BP 08/03/23 1117 130/79     Systolic BP Percentile --      Diastolic BP Percentile --      Pulse Rate 08/03/23 1117 71     Resp 08/03/23 1117 17     Temp 08/03/23 1117 98.2 F (36.8 C)     Temp src --      SpO2 08/03/23 1117 99 %     Weight 08/03/23 1116 178 lb (80.7 kg)     Height 08/03/23 1116 6\' 1"  (1.854 m)     Head Circumference --      Peak Flow --      Pain Score 08/03/23 1115 3     Pain Loc --      Pain Education --      Exclude from Growth Chart --     Most recent vital signs: Vitals:   08/03/23 1117  BP: 130/79  Pulse: 71  Resp: 17  Temp: 98.2 F (36.8 C)  SpO2: 99%    Physical Exam Vitals and nursing note reviewed.  Constitutional:      General: Awake and alert. No acute distress.    Appearance: Normal appearance. The patient is normal weight.  HENT:     Head: Normocephalic and atraumatic.     Mouth: Mucous membranes are moist.  Eyes:     General: PERRL. Normal EOMs         Right eye: No discharge.        Left eye: No discharge.     Conjunctiva/sclera: Conjunctivae normal.  Cardiovascular:     Rate and Rhythm: Normal rate and regular rhythm.     Pulses: Normal pulses.  Pulmonary:     Effort: Pulmonary effort is normal. No respiratory distress.     Breath sounds: Normal breath sounds.  Abdominal:     Abdomen is soft. There is no abdominal tenderness. No rebound or guarding. No distention. Musculoskeletal:        General: No swelling. Normal range of motion.     Cervical back: Normal range of motion and neck supple.  No lower extremity swelling or calf tenderness Skin:    General: Skin is warm and dry.     Capillary Refill: Capillary refill takes less than 2 seconds.     Findings: No rash.  Neurological:  Mental Status: The patient is awake and alert.      ED Results / Procedures / Treatments   Labs (all labs ordered are listed, but only abnormal results are displayed) Labs Reviewed  BASIC METABOLIC PANEL - Abnormal; Notable for the following components:      Result Value   Calcium 8.7 (*)    All other components within normal limits  CBC  TROPONIN I (HIGH SENSITIVITY)  TROPONIN I (HIGH SENSITIVITY)     EKG     RADIOLOGY I independently reviewed and interpreted imaging and agree with radiologists findings.     PROCEDURES:  Critical Care performed:   Procedures   MEDICATIONS ORDERED IN ED: Medications - No data to display   IMPRESSION / MDM / ASSESSMENT AND PLAN / ED COURSE  I reviewed the triage vital signs and the nursing notes.   Differential diagnosis includes, but is not limited to, acute coronary syndrome, anxiety, URI, pneumonia, pneumothorax.  Patient is awake and alert, hemodynamically stable and afebrile.  He is nontoxic in appearance.  EKG obtained in triage is nonischemic, also reviewed by attending MD.  Chest x-ray demonstrates no evidence of cardiopulmonary abnormality.  Labs obtained are overall  reassuring.  No radiation of pain to his back, no focal neurological deficits, no hemodynamic instability, I do not suspect aortic dissection.  No tachycardia or hypoxia, no pleurisy, no hemoptysis, no clinical signs or symptoms of DVT, do not suspect PE as a source of his symptoms today.  Patient reassessed multiple times and had no recurrence of pain.  We discussed strict return precautions the importance of close outpatient follow-up.  Patient understands and agrees with plan.  He was discharged in stable condition.   Patient's presentation is most consistent with acute complicated illness / injury requiring diagnostic workup.     FINAL CLINICAL IMPRESSION(S) / ED DIAGNOSES   Final diagnoses:  Atypical chest pain     Rx / DC Orders   ED Discharge Orders     None        Note:  This document was prepared using Dragon voice recognition software and may include unintentional dictation errors.   Jackelyn Hoehn, PA-C 08/03/23 1437    Janith Lima, MD 08/03/23 [phone]

## 2024-08-10 ENCOUNTER — Ambulatory Visit: Admission: EM | Admit: 2024-08-10 | Discharge: 2024-08-10 | Disposition: A | Discharge status: Home / Self Care

## 2024-08-10 ENCOUNTER — Encounter: Payer: Self-pay | Admitting: Emergency Medicine

## 2024-08-10 DIAGNOSIS — J0100 Acute maxillary sinusitis, unspecified: Secondary | ICD-10-CM

## 2024-08-10 LAB — POCT INFLUENZA A/B
Influenza A, POC: NEGATIVE
Influenza B, POC: NEGATIVE

## 2024-08-10 LAB — POC SOFIA SARS ANTIGEN FIA: SARS Coronavirus 2 Ag: NEGATIVE

## 2024-08-10 MED ORDER — PREDNISONE 10 MG (21) PO TBPK: ORAL_TABLET | Freq: Every day | ORAL | 0 refills | Status: AC

## 2024-08-10 MED ORDER — AZITHROMYCIN 250 MG PO TABS: 250.0000 mg | ORAL_TABLET | Freq: Every day | ORAL | 0 refills | Status: AC

## 2024-08-10 NOTE — ED Triage Notes (Signed)
 Patient reports that his family tested positive for Flu on 07-18-24 Patient was given Tamiflu on that date. Patient now complains of sinus pressure, bilateral ear fullness and pain and headache. Patient states that he has a history of [facility] Spotted Fever and states Every time I get sick it always turns ito pneumonia. Rates ear 5/10 headache 8/10.

## 2024-08-10 NOTE — ED Provider Notes (Signed)
 [facility]    CSN: [id number] Arrival date & time: 08/10/24  0809      History   Chief Complaint Chief Complaint  Patient presents with   Facial Pain   Headache   Otalgia   Ear Fullness    HPI Kyle Bernard is a 39 y.o. male.   Patient presents for evaluation of nasal congestion, bilateral ear fullness, sinus pain and pressure below the eyes, productive cough and intermittent headaches present for 2 days.  Has attempted use of Mucinex Tylenol and Nettie pot.  Recently diagnosed with influenza on 07/18/24, took Tamiflu, symptoms fully resolved.  Endorses a history of pneumonia after viral infections and a history of [place] spotted fever.  Denies shortness of breath wheezing or fever.    Past Medical History:  Diagnosis Date   Migraines     There are no active problems to display for this patient.   Past Surgical History:  Procedure Laterality Date   HERNIA REPAIR Bilateral 08/08/2018   NECK SURGERY     SHOULDER SURGERY Right 2020       Home Medications    Prior to Admission medications  Medication Sig Start Date End Date Taking? Authorizing Provider  diazepam  (VALIUM ) 5 MG tablet Take 5 mg by mouth every 8 (eight) hours as needed. 01/27/23  Yes [provider]  busPIRone (BUSPAR) 10 MG tablet Take 10 mg by mouth 2 (two) times daily. 10/21/21   [provider]  zolmitriptan (ZOMIG) 5 MG nasal solution Place into the nose. 04/10/19   [provider]  zolmitriptan (ZOMIG-ZMT) 5 MG disintegrating tablet Take 5 mg by mouth as needed.    [provider]    Family History History reviewed. No pertinent family history.  Social History Social History[1]   Allergies   Morphine and codeine   Review of Systems Review of Systems  HENT:  Positive for congestion, ear pain, sinus pressure and sinus pain. Negative for dental problem, drooling, ear discharge, facial swelling, hearing loss, mouth sores, nosebleeds, postnasal  drip, rhinorrhea, sneezing, sore throat, tinnitus, trouble swallowing and voice change.   Respiratory:  Positive for cough. Negative for apnea, choking, chest tightness, shortness of breath, wheezing and stridor.   Gastrointestinal: Negative.   Neurological:  Positive for headaches. Negative for dizziness, tremors, seizures, syncope, facial asymmetry, speech difficulty, weakness, light-headedness and numbness.     Physical Exam Triage Vital Signs ED Triage Vitals  Encounter Vitals Group     BP 08/10/24 0916 118/70     Girls Systolic BP Percentile --      Girls Diastolic BP Percentile --      Boys Systolic BP Percentile --      Boys Diastolic BP Percentile --      Pulse Rate 08/10/24 0916 60     Resp 08/10/24 0916 20     Temp 08/10/24 0916 97.9 F (36.6 C)     Temp Source 08/10/24 0916 Oral     SpO2 08/10/24 0916 97 %     Weight --      Height --      Head Circumference --      Peak Flow --      Pain Score 08/10/24 0922 5     Pain Loc --      Pain Education --      Exclude from Growth Chart --    No data found.  Updated Vital Signs BP 118/70 (BP Location: Right Arm)   Pulse  60   Temp 97.9 F (36.6 C) (Oral)   Resp 20   SpO2 97%   Visual Acuity Right Eye Distance:   Left Eye Distance:   Bilateral Distance:    Right Eye Near:   Left Eye Near:    Bilateral Near:     Physical Exam Constitutional:      Appearance: Normal appearance.  HENT:     Right Ear: Tympanic membrane, ear canal and external ear normal.     Left Ear: Tympanic membrane, ear canal and external ear normal.     Nose: Congestion present.     Right Sinus: Maxillary sinus tenderness present.     Mouth/Throat:     Mouth: Mucous membranes are moist.     Pharynx: No oropharyngeal exudate or posterior oropharyngeal erythema.  Eyes:     Extraocular Movements: Extraocular movements intact.  Cardiovascular:     Rate and Rhythm: Normal rate and regular rhythm.     Pulses: Normal pulses.     Bernard  sounds: Normal Bernard sounds.  Pulmonary:     Effort: Pulmonary effort is normal.     Breath sounds: Normal breath sounds.  Neurological:     Mental Status: He is alert.      UC Treatments / Results  Labs (all labs ordered are listed, but only abnormal results are displayed) Labs Reviewed - No data to display  EKG   Radiology No results found.  Procedures Procedures (including critical care time)  Medications Ordered in UC Medications - No data to display  Initial Impression / Assessment and Plan / UC Course  I have reviewed the triage vital signs and the nursing notes.  Pertinent labs & imaging results that were available during my care of the patient were reviewed by me and considered in my medical decision making (see chart for details).  Acute nonrecurrent maxillary sinusitis  Patient is in no signs of distress nor toxic appearing.  Vital signs are stable.  Low suspicion for pneumonia, pneumothorax or bronchitis and therefore will defer imaging.  COVID and flu testing negative.  Empirically placed on azithromycin  for management of his sinusitis additionally prescribed prednisone  for comfort.May use additional over-the-counter medications as needed for supportive care.  May follow-up with urgent care as needed if symptoms persist or worsen.  Final Clinical Impressions(s) / UC Diagnoses   Final diagnoses:  Viral illness   Discharge Instructions   None    ED Prescriptions   None    PDMP not reviewed this encounter.     [1]  Social History Tobacco Use   Smoking status: Never   Smokeless tobacco: Never  Vaping Use   Vaping status: Never Used  Substance Use Topics   Alcohol use: Never   Drug use: Never     Teresa Shelba SAUNDERS, NP 08/10/24 1000  "

## 2024-08-10 NOTE — Discharge Instructions (Signed)
 Take azithromycin  as directed for coverage for a sinus infection  Begin prednisone  every morning with food as directed to reduce inflammation within the sinus    You can take Tylenol and/or Ibuprofen as needed for fever reduction and pain relief.   For cough: honey 1/2 to 1 teaspoon (you can dilute the honey in water or another fluid).  You can also use guaifenesin and dextromethorphan for cough. You can use a humidifier for chest congestion and cough.  If you don't have a humidifier, you can sit in the bathroom with the hot shower running.      For sore throat: try warm salt water gargles, cepacol lozenges, throat spray, warm tea or water with lemon/honey, popsicles or ice, or OTC cold relief medicine for throat discomfort.   For congestion: take a daily anti-histamine like Zyrtec, Claritin, and a oral decongestant, such as pseudoephedrine.  You can also use Flonase 1-2 sprays in each nostril daily.   It is important to stay hydrated: drink plenty of fluids (water, gatorade/powerade/pedialyte, juices, or teas) to keep your throat moisturized and help further relieve irritation/discomfort.

## 2024-08-29 ENCOUNTER — Emergency Department

## 2024-08-29 ENCOUNTER — Emergency Department
Admission: EM | Admit: 2024-08-29 | Discharge: 2024-08-30 | Disposition: A | Discharge status: Home / Self Care | Attending: Emergency Medicine | Admitting: Emergency Medicine

## 2024-08-29 DIAGNOSIS — R202 Paresthesia of skin: Secondary | ICD-10-CM | POA: Diagnosis not present

## 2024-08-29 DIAGNOSIS — R079 Chest pain, unspecified: Secondary | ICD-10-CM | POA: Insufficient documentation

## 2024-08-29 DIAGNOSIS — R42 Dizziness and giddiness: Secondary | ICD-10-CM | POA: Insufficient documentation

## 2024-08-29 LAB — CBC WITH DIFFERENTIAL/PLATELET
Abs Immature Granulocytes: 0.02 K/uL (ref 0.00–0.07)
Basophils Absolute: 0 K/uL (ref 0.0–0.1)
Basophils Relative: 0 %
Eosinophils Absolute: 0.1 K/uL (ref 0.0–0.5)
Eosinophils Relative: 1 %
HCT: 39.8 % (ref 39.0–52.0)
Hemoglobin: 14.2 g/dL (ref 13.0–17.0)
Immature Granulocytes: 0 %
Lymphocytes Relative: 17 %
Lymphs Abs: 1.5 K/uL (ref 0.7–4.0)
MCH: 31.2 pg (ref 26.0–34.0)
MCHC: 35.7 g/dL (ref 30.0–36.0)
MCV: 87.5 fL (ref 80.0–100.0)
Monocytes Absolute: 0.3 K/uL (ref 0.1–1.0)
Monocytes Relative: 4 %
Neutro Abs: 7 K/uL (ref 1.7–7.7)
Neutrophils Relative %: 78 %
Platelets: 224 K/uL (ref 150–400)
RBC: 4.55 MIL/uL (ref 4.22–5.81)
RDW: 12.2 % (ref 11.5–15.5)
WBC: 9 K/uL (ref 4.0–10.5)
nRBC: 0 % (ref 0.0–0.2)

## 2024-08-29 LAB — COMPREHENSIVE METABOLIC PANEL WITH GFR
ALT: 35 U/L (ref 0–44)
AST: 23 U/L (ref 15–41)
Albumin: 4.7 g/dL (ref 3.5–5.0)
Alkaline Phosphatase: 53 U/L (ref 38–126)
Anion gap: 14 (ref 5–15)
BUN: 15 mg/dL (ref 6–20)
CO2: 21 mmol/L — ABNORMAL LOW (ref 22–32)
Calcium: 8.9 mg/dL (ref 8.9–10.3)
Chloride: 103 mmol/L (ref 98–111)
Creatinine, Ser: 0.82 mg/dL (ref 0.61–1.24)
GFR, Estimated: >60 mL/min
Glucose, Bld: 125 mg/dL — ABNORMAL HIGH (ref 70–99)
Potassium: 3.6 mmol/L (ref 3.5–5.1)
Sodium: 137 mmol/L (ref 135–145)
Total Bilirubin: 0.8 mg/dL (ref 0.0–1.2)
Total Protein: 7 g/dL (ref 6.5–8.1)

## 2024-08-29 LAB — TROPONIN T, HIGH SENSITIVITY: Troponin T High Sensitivity: <6 ng/L (ref 0–19)

## 2024-08-29 MED ORDER — MECLIZINE HCL 25 MG PO TABS: 25.0000 mg | ORAL_TABLET | Freq: Three times a day (TID) | ORAL | 0 refills | Status: AC | PRN

## 2024-08-29 MED ORDER — LACTATED RINGERS IV BOLUS
1000.0000 mL | Freq: Once | INTRAVENOUS | Status: AC
  Administered 2024-08-29: 1000 mL via INTRAVENOUS

## 2024-08-29 MED ORDER — DIPHENHYDRAMINE HCL 50 MG/ML IJ SOLN
50.0000 mg | Freq: Once | INTRAMUSCULAR | Status: AC
  Administered 2024-08-29: 50 mg via INTRAVENOUS
  Filled 2024-08-29: qty 1

## 2024-08-29 MED ORDER — ONDANSETRON 4 MG PO TBDP: 4.0000 mg | ORAL_TABLET | Freq: Three times a day (TID) | ORAL | 0 refills | Status: AC | PRN

## 2024-08-29 MED ORDER — PROCHLORPERAZINE EDISYLATE 10 MG/2ML IJ SOLN
5.0000 mg | Freq: Once | INTRAMUSCULAR | Status: AC
  Administered 2024-08-29: 5 mg via INTRAVENOUS
  Filled 2024-08-29: qty 2

## 2024-08-29 NOTE — ED Provider Notes (Signed)
 "  [facility] Provider Note    Event Date/Time   First MD Initiated Contact with Patient 08/29/24 2038     (approximate)   History   Chief Complaint Dizziness   HPI  Damaria Stofko is a 39 y.o. male with past medical history of migraines who presents to the ED complaining of dizziness.  Patient reports that approximately 1 hour prior to arrival he had acute onset of dizziness and lightheadedness with numbness and tingling in all 4 extremities.  He began to feel nauseous and vomited as well.  He was able to drive himself home and took some Valium  because he thought symptoms could be related to anxiety.  He also developed some tightness in his chest and mild difficulty breathing around that time.  When he did not have any relief, he decided to call EMS.  He describes a similar episode in the past.     Physical Exam   Triage Vital Signs: ED Triage Vitals  Encounter Vitals Group     BP 08/29/24 2041 (!) 132/91     Girls Systolic BP Percentile --      Girls Diastolic BP Percentile --      Boys Systolic BP Percentile --      Boys Diastolic BP Percentile --      Pulse Rate 08/29/24 2041 77     Resp 08/29/24 2041 16     Temp 08/29/24 2041 (!) 97.3 F (36.3 C)     Temp Source 08/29/24 2041 Oral     SpO2 08/29/24 2041 100 %     Weight 08/29/24 2042 172 lb (78 kg)     Height 08/29/24 2042 6' 1 (1.854 m)     Head Circumference --      Peak Flow --      Pain Score 08/29/24 2044 0     Pain Loc --      Pain Education --      Exclude from Growth Chart --     Most recent vital signs: Vitals:   08/29/24 2245 08/29/24 2300  BP:  93/61  Pulse: 75 (!) 56  Resp: 13 14  Temp:    SpO2: 100% 96%    Constitutional: Alert and oriented. Eyes: Conjunctivae are normal. Head: Atraumatic. Nose: No congestion/rhinnorhea. Mouth/Throat: Mucous membranes are moist.  Cardiovascular: Normal rate, regular rhythm. Grossly normal heart sounds.  2+ radial pulses  bilaterally. Respiratory: Normal respiratory effort.  No retractions. Lungs CTAB. Gastrointestinal: Soft and nontender. No distention. Musculoskeletal: No lower extremity tenderness nor edema.  Neurologic:  Normal speech and language. No gross focal neurologic deficits are appreciated.    ED Results / Procedures / Treatments   Labs (all labs ordered are listed, but only abnormal results are displayed) Labs Reviewed  COMPREHENSIVE METABOLIC PANEL WITH GFR - Abnormal; Notable for the following components:      Result Value   CO2 21 (*)    Glucose, Bld 125 (*)    All other components within normal limits  CBC WITH DIFFERENTIAL/PLATELET  TROPONIN T, HIGH SENSITIVITY  TROPONIN T, HIGH SENSITIVITY     EKG  ED ECG REPORT I, Carlin Palin, the attending physician, personally viewed and interpreted this ECG.   Date: 08/29/2024  EKG Time: 20:42  Rate: 82  Rhythm: normal sinus rhythm  Axis: Normal  Intervals:none  ST&T Change: None  RADIOLOGY Chest x-ray reviewed and interpreted by me with no infiltrate, edema, or effusion.  PROCEDURES:  Critical Care performed: No  Procedures   MEDICATIONS ORDERED IN ED: Medications  lactated ringers  bolus 1,000 mL (0 mLs Intravenous Stopped 08/29/24 2321)  prochlorperazine  (COMPAZINE ) injection 5 mg (5 mg Intravenous Given 08/29/24 2213)  diphenhydrAMINE  (BENADRYL ) injection 50 mg (50 mg Intravenous Given 08/29/24 2215)     IMPRESSION / MDM / ASSESSMENT AND PLAN / ED COURSE  I reviewed the triage vital signs and the nursing notes.                              39 y.o. male with past medical history of migraines who presents to the ED complaining of acute onset dizziness, lightheadedness, numbness in all 4 extremities, nausea, vomiting, chest pain, shortness of breath.  Patient's presentation is most consistent with acute presentation with potential threat to life or bodily function.  Differential diagnosis includes, but is not  limited to, stroke, TIA, ACS, PE, pneumonia, pneumothorax, arrhythmia, anemia, electrolyte abnormality, AKI, anxiety.  Patient nontoxic-appearing and in no acute distress, vital signs are unremarkable.  Patient has a nonfocal neurologic exam and describes dizziness as lightheadedness, low suspicion for stroke.  EKG shows no evidence of arrhythmia or ischemia and symptoms seem atypical for ACS, will observe on cardiac monitor and screen 2 sets of troponin.  Additional labs are also pending, nausea improving following dose of Zofran , will hydrate with IV fluids.  CT head and chest x-ray are negative for acute finding.  Labs without significant anemia, leukocytosis, electrolyte abnormality, or AKI.  Initial troponin within normal limits, repeat troponin is pending.  Patient now reports sensation of the room spinning around him with ongoing nausea, had improvement in symptoms following dose of Compazine  and Benadryl .  Patient turned over to oncoming divider pending repeat troponin and p.o. challenge.      FINAL CLINICAL IMPRESSION(S) / ED DIAGNOSES   Final diagnoses:  Dizziness  Paresthesia  Nonspecific chest pain     Rx / DC Orders   ED Discharge Orders          Ordered    ondansetron  (ZOFRAN -ODT) 4 MG disintegrating tablet  Every 8 hours PRN        08/29/24 2330    meclizine  (ANTIVERT ) 25 MG tablet  3 times daily PRN        08/29/24 2330             Note:  This document was prepared using Dragon voice recognition software and may include unintentional dictation errors.   Willo Dunnings, MD 08/29/24 2332  "

## 2024-08-29 NOTE — ED Provider Notes (Signed)
----------------------------------------- °  11:34 PM on 08/29/2024 -----------------------------------------  Blood pressure 93/61, pulse (!) 56, temperature (!) 97.3 F (36.3 C), temperature source Oral, resp. rate 14, height 6' 1 (1.854 m), weight 78 kg, SpO2 96%.    In short, Kyle Bernard is a 39 y.o. male with a chief complaint of dizziness.  Refer to the original H&P for additional details.  The current plan of care is to repeat second troponin.  If negative, discharge.  Troponin x 2 is negative.  On reassessment patient's feeling significantly better.  He has several blood pressure readings that are in the systolic 90s, this was when he was sleeping and leaning to the side.  When we repeated it when he was laying on his back, blood pressures were stable in the 110s.  States that he is symptomatically feeling a lot better.  Was able to ambulate without any recurrence of dizziness or lightheadedness.  Considered but no indication for inpatient admission at this time, he safe for outpatient management.  Will discharge with strict return precautions.  Prescriptions for meclizine  and Zofran  was sent by Dr. Willo.  Did discuss with patient that meclizine  can be slightly sedating so to try and take it at night and not to drive when he is on meclizine .     Medications  lactated ringers  bolus 1,000 mL (0 mLs Intravenous Stopped 08/29/24 2321)  prochlorperazine  (COMPAZINE ) injection 5 mg (5 mg Intravenous Given 08/29/24 2213)  diphenhydrAMINE  (BENADRYL ) injection 50 mg (50 mg Intravenous Given 08/29/24 2215)     ED Discharge Orders          Ordered    ondansetron  (ZOFRAN -ODT) 4 MG disintegrating tablet  Every 8 hours PRN        08/29/24 2330    meclizine  (ANTIVERT ) 25 MG tablet  3 times daily PRN        08/29/24 2330           Final diagnoses:  Dizziness  Paresthesia  Nonspecific chest pain      Waymond Lorelle Cummins, MD 08/30/24 [phone]

## 2024-08-29 NOTE — ED Triage Notes (Signed)
 Pt to ED BIB EMS with c/o sudden onset dizziness while driving. Also reports some chest tightness of shortness of breath. Noted to be pale and diaphoretic on arrival. Pt took his prescribed valium  prior to EMS arrival thinking this was possibly anxiety but reports no relief. Received 4mg  zofran  with EMS with no relief.

## 2024-08-29 NOTE — ED Notes (Signed)
 Pt transported to CT ?

## 2024-08-30 LAB — TROPONIN T, HIGH SENSITIVITY: Troponin T High Sensitivity: <6 ng/L (ref 0–19)
